# Patient Record
Sex: Male | Born: 2000 | Race: Black or African American | Hispanic: No | Marital: Single | Smoking: Current every day smoker
Health system: Southern US, Community
[De-identification: ages and names within clinical notes are randomized; demographics above are authoritative.]

---
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
## 2020-10-09 IMAGING — US US SCROTUM W/ DOPPLER COMPLETE
1 series · 14 of 25 positions shown · non-contrast
Comparison: None.

CLINICAL DATA: LEFT testicular pain for 3 days.

EXAM:
SCROTAL ULTRASOUND
DOPPLER ULTRASOUND OF THE TESTICLES
TECHNIQUE: Complete ultrasound examination of the testicles, epididymis, and
other scrotal structures was performed. Color and spectral Doppler
ultrasound were also utilized to evaluate blood flow to the
testicles.

[Series 1: us scrotum w/ doppler complete · 14 of 50 slices shown]
[im 1/50]
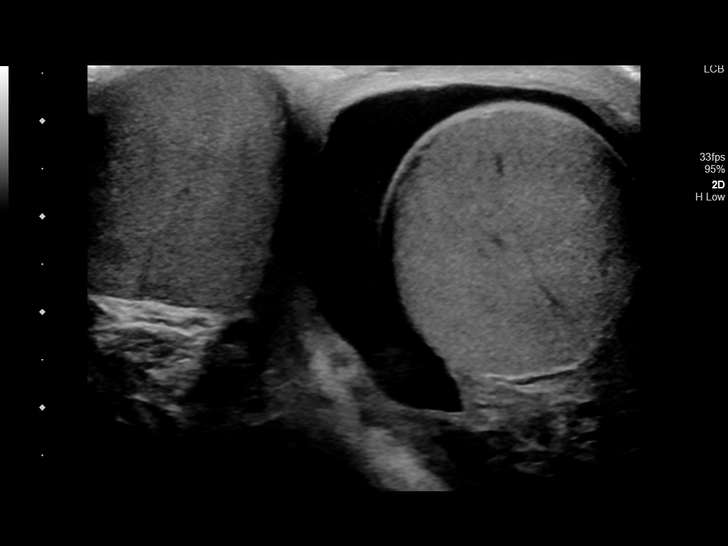
[im 5/50]
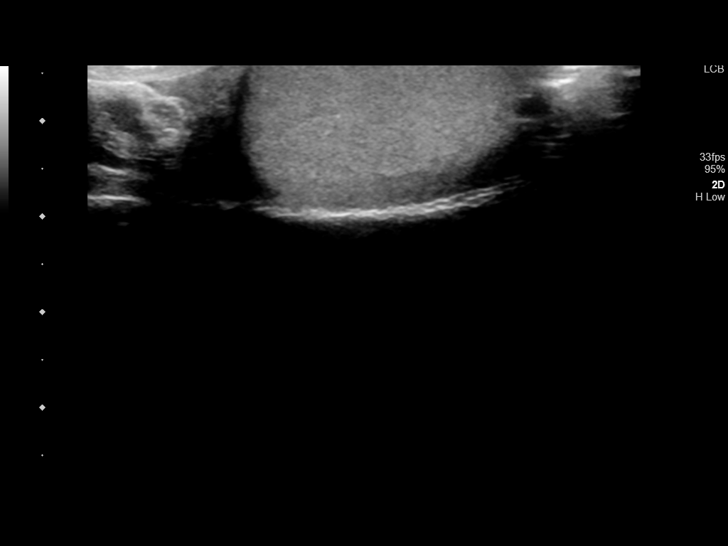
[im 9/50]
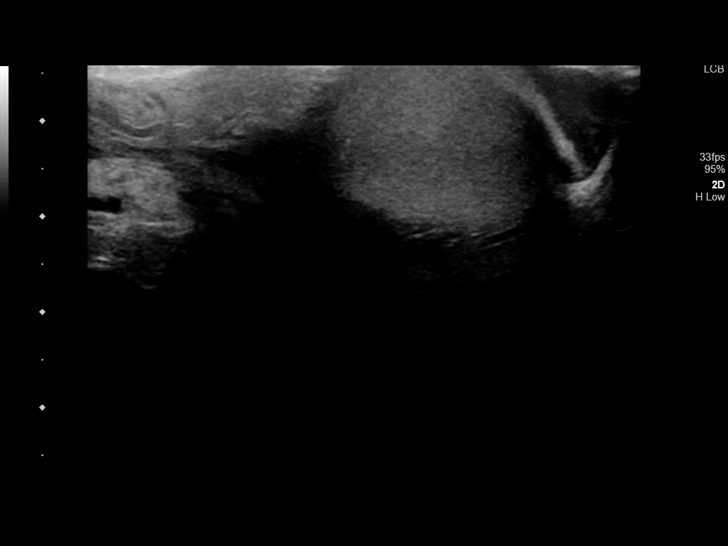
[im 13/50]
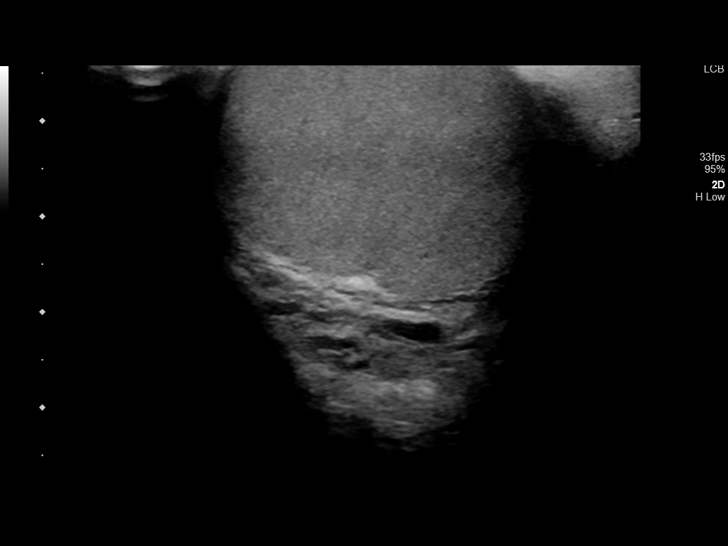
[im 17/50]
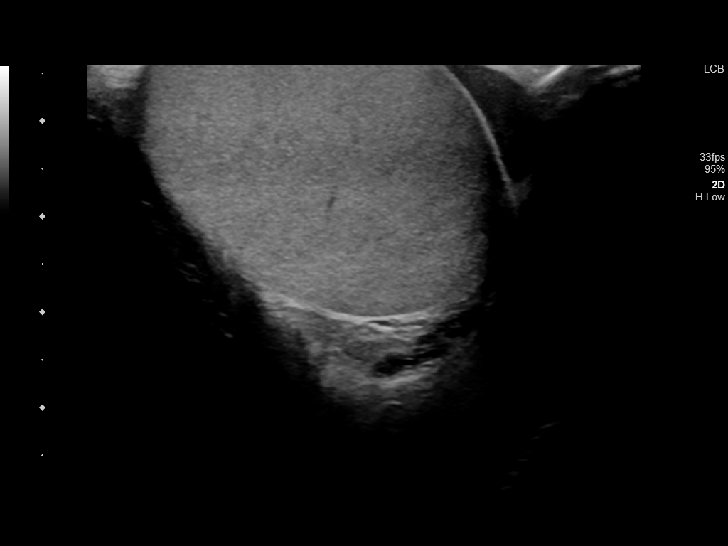
[im 19/50]
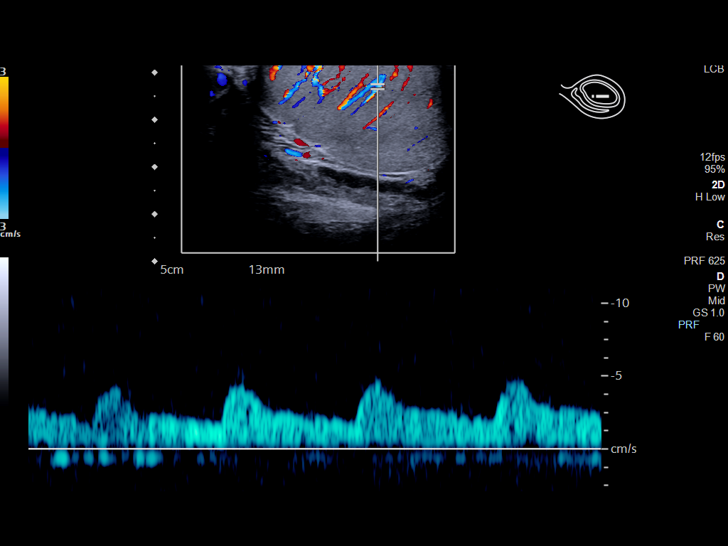
[im 23/50]
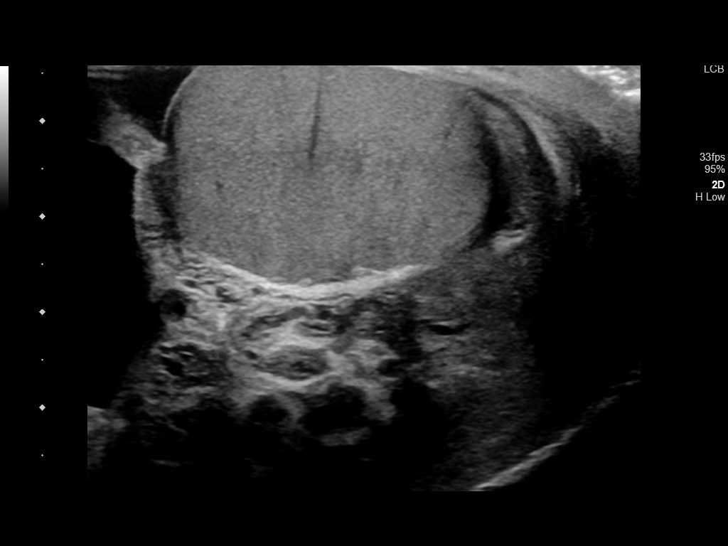
[im 27/50]
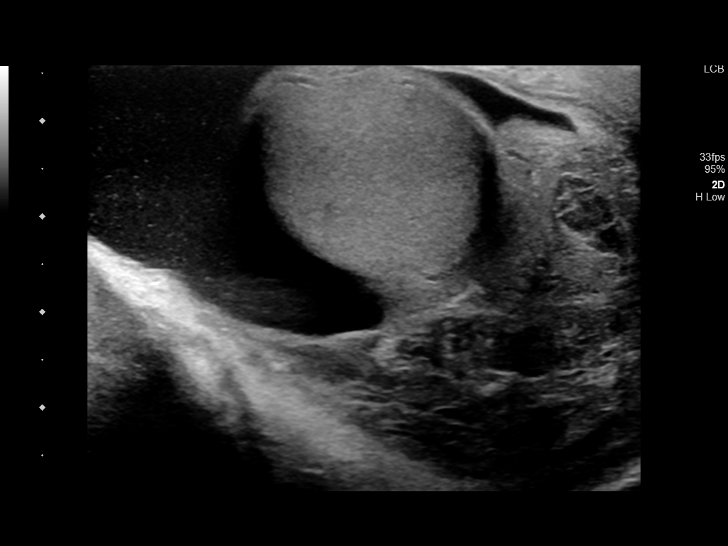
[im 31/50]
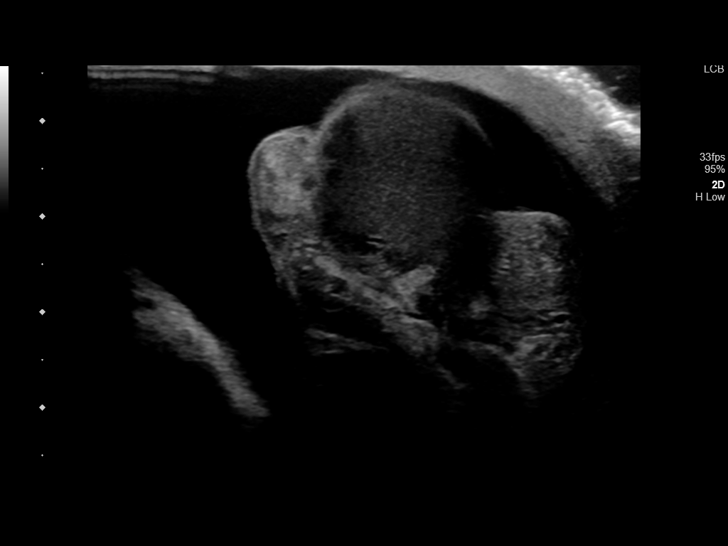
[im 33/50]
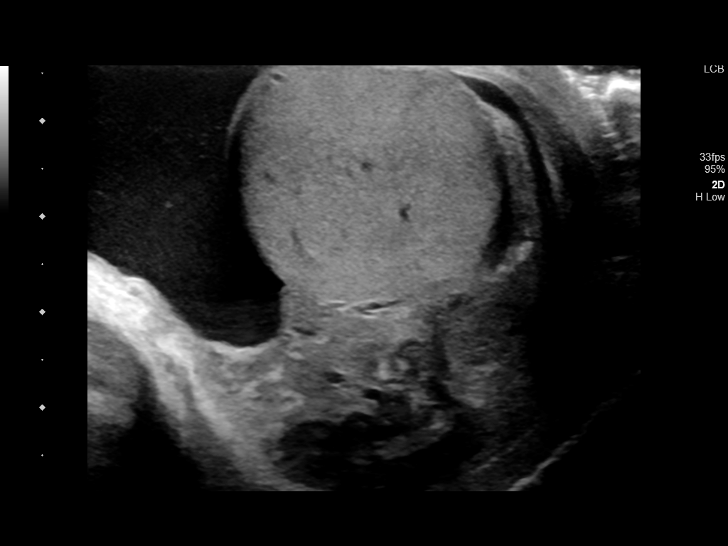
[im 37/50]
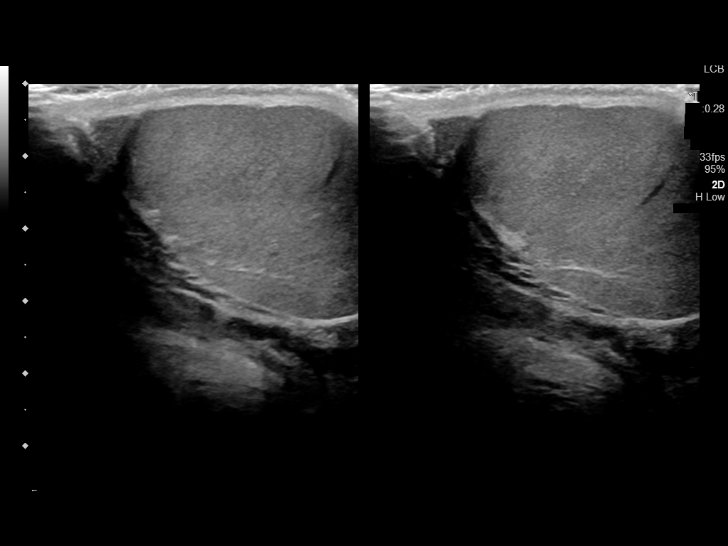
[im 41/50]
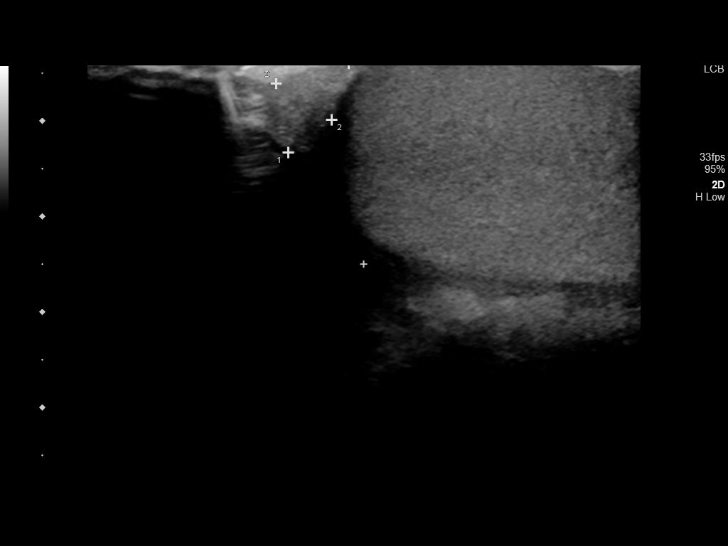
[im 45/50]
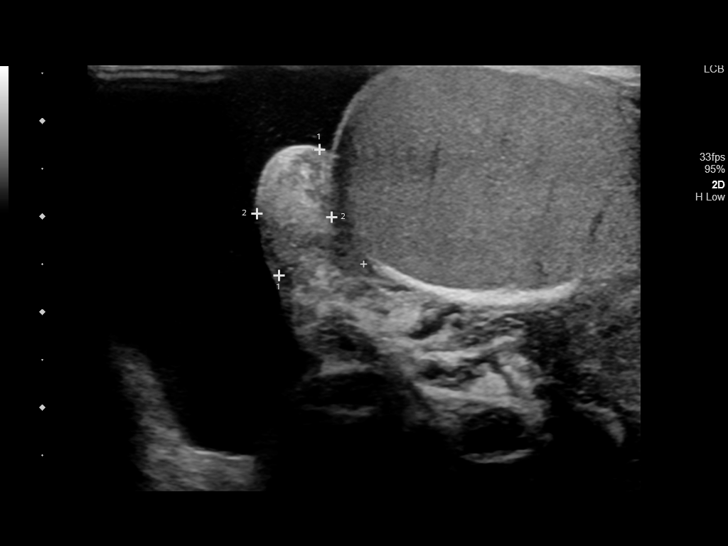
[im 50/50]
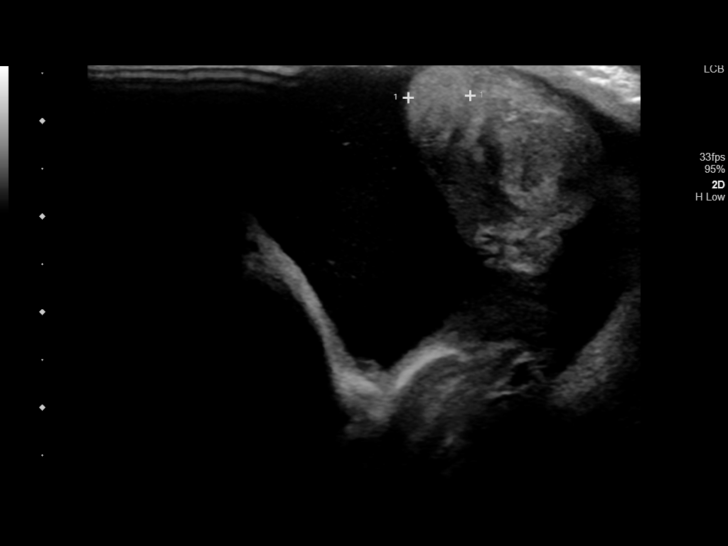

[14 of 25 positions shown; findings below may reference images not displayed]

FINDINGS: Right testicle

Measurements: 4.5 x 2.6 x 3.4 cm. No mass or microlithiasis
visualized.

Left testicle

Measurements: 3.7 x 2.5 x 2.8 cm. No mass or microlithiasis
visualized.

Right epididymis:  Normal in size and appearance.

Left epididymis: Prominent in size and hypervascular suggesting
epididymitis.

Hydrocele:  LEFT-sided hydrocele, moderate to large in size.

Varicocele:  None visualized.

Pulsed Doppler interrogation of both testes demonstrates normal low
resistance arterial and venous waveforms bilaterally.
IMPRESSION: 1. Probable LEFT-sided epididymitis with associated hydrocele.
2. No evidence of testicular torsion or orchitis.
